# Patient Record
Sex: Female | Born: 1960 | Race: White | Hispanic: No | Marital: Single | Smoking: Never smoker
Health system: Southern US, Community
[De-identification: ages and names within clinical notes are randomized; demographics above are authoritative.]

---

## 1999-05-20 ENCOUNTER — Other Ambulatory Visit: Admission: RE | Admit: 1999-05-20 | Discharge: 1999-05-20 | Payer: Self-pay | Admitting: Obstetrics and Gynecology

## 2007-07-22 ENCOUNTER — Encounter: Admission: RE | Admit: 2007-07-22 | Discharge: 2007-07-22 | Payer: Self-pay | Admitting: Obstetrics and Gynecology

## 2007-08-10 ENCOUNTER — Encounter: Admission: RE | Admit: 2007-08-10 | Discharge: 2007-08-10 | Payer: Self-pay | Admitting: Obstetrics and Gynecology

## 2007-08-10 ENCOUNTER — Encounter (INDEPENDENT_AMBULATORY_CARE_PROVIDER_SITE_OTHER): Payer: Self-pay | Admitting: Diagnostic Radiology

## 2010-02-07 ENCOUNTER — Encounter: Admission: RE | Admit: 2010-02-07 | Discharge: 2010-02-07 | Payer: Self-pay | Admitting: Obstetrics and Gynecology

## 2010-03-30 ENCOUNTER — Encounter: Payer: Self-pay | Admitting: Internal Medicine

## 2013-04-25 ENCOUNTER — Ambulatory Visit: Payer: Self-pay | Admitting: Podiatry

## 2013-05-04 ENCOUNTER — Ambulatory Visit: Payer: Self-pay | Admitting: Podiatry

## 2013-05-23 ENCOUNTER — Ambulatory Visit (INDEPENDENT_AMBULATORY_CARE_PROVIDER_SITE_OTHER): Payer: Managed Care, Other (non HMO) | Admitting: Podiatry

## 2013-05-23 ENCOUNTER — Ambulatory Visit (INDEPENDENT_AMBULATORY_CARE_PROVIDER_SITE_OTHER): Payer: Managed Care, Other (non HMO)

## 2013-05-23 ENCOUNTER — Encounter: Payer: Self-pay | Admitting: Podiatry

## 2013-05-23 DIAGNOSIS — M722 Plantar fascial fibromatosis: Secondary | ICD-10-CM

## 2013-05-23 DIAGNOSIS — G588 Other specified mononeuropathies: Secondary | ICD-10-CM

## 2013-05-23 DIAGNOSIS — G5760 Lesion of plantar nerve, unspecified lower limb: Secondary | ICD-10-CM

## 2013-05-23 DIAGNOSIS — R52 Pain, unspecified: Secondary | ICD-10-CM

## 2013-05-23 MED ORDER — METHYLPREDNISOLONE (PAK) 4 MG PO TABS: ORAL_TABLET | ORAL | Status: DC

## 2013-05-23 MED ORDER — MELOXICAM 15 MG PO TABS: 15.0000 mg | ORAL_TABLET | Freq: Every day | ORAL | Status: DC

## 2013-05-23 NOTE — Progress Notes (Signed)
   Subjective:    Patient ID: Michele MilanPeggy A Lapid, female    DOB: Jul 18, 1960, 53 y.o.   MRN: [id number]  HPI PT STATED RT BALL OF THE FOOT HAVING NERVE PAIN AND LT FOOT HEEL PAIN FOR 2-3 MONTHS. BOTH FEET ARE GETTING WORSE. THE FEET GET AGGRAVATED BY WALKING AND PUTTING PRESSURE. TRIED TO WEAR INSERTS BUT DID NOT HELP.    Review of Systems  Constitutional: Positive for activity change and unexpected weight change.  Musculoskeletal: Positive for gait problem and joint swelling.  Neurological: Positive for headaches.  Hematological: Bruises/bleeds easily.       Objective:   Physical Exam: I have reviewed her past medical history medications allergies surgeries and social history. Vital signs are stable she is alert and oriented x3. Pulses are palpable bilateral. Neurologic sensorium is intact per Semmes-Weinstein monofilament. She has a palpable Mulder's click to the third interdigital space of the right foot. Deep tendon reflexes are intact bilateral muscle strength is 5 over 5 dorsiflexors plantar flexors inverters everters all intrinsic musculature is intact. Orthopedic evaluation demonstrates all joints distal to the ankle a full range of motion without crepitation. She has pain on palpation medial continued tubercle of the left heel. Radiographic evaluation the bilateral foot demonstrates a rectus foot right without complications left foot does demonstrate plantar distally oriented calcaneal heel spur with soft tissue increase in density at the plantar fascial calcaneal insertion site of the left heel.        Assessment & Plan:  Assessment: Plantar fasciitis left. Capsulitis second metatarsophalangeal joint right.  Plan: Discussed the etiology pathology conservative versus surgical therapies. Dispensed a prescription for Medrol Dosepak injected the left heel today as well as the second metatarsophalangeal joint. With Kenalog and local anesthetic. Dispensed a night splint left and a plantar  fascial strapping left I will followup with her in one month.

## 2013-06-06 ENCOUNTER — Ambulatory Visit: Payer: Self-pay | Admitting: Sports Medicine

## 2013-06-20 ENCOUNTER — Ambulatory Visit (INDEPENDENT_AMBULATORY_CARE_PROVIDER_SITE_OTHER): Payer: Managed Care, Other (non HMO) | Admitting: Podiatry

## 2013-06-20 VITALS — BP 138/90 | HR 89 | Resp 16

## 2013-06-20 DIAGNOSIS — M778 Other enthesopathies, not elsewhere classified: Secondary | ICD-10-CM

## 2013-06-20 DIAGNOSIS — M779 Enthesopathy, unspecified: Principal | ICD-10-CM

## 2013-06-20 DIAGNOSIS — M7750 Other enthesopathy of unspecified foot: Secondary | ICD-10-CM

## 2013-06-21 NOTE — Progress Notes (Signed)
She presents today saying her left foot is she refers to the plantar fasciitis. However she still has pain at the second metatarsophalangeal joint of the right foot.  Objective: Vital signs are stable she is alert and oriented x3. Pulses are strongly palpable to the right foot. She has pain on in range of motion of the second metatarsophalangeal joint of the right foot.  Assessment: Pain in limb secondary to capsulitis of the second metatarsophalangeal joint with mild hammertoe deformity.  Plan: Discussed etiology pathology conservative versus surgical therapies. I did suggest another injection today with dexamethasone which was performed after sterile Betadine skin prep. She will continue all other conservative therapies will followup with her in 3 weeks or so.

## 2013-07-11 ENCOUNTER — Ambulatory Visit: Payer: Self-pay | Admitting: Sports Medicine

## 2013-12-07 DIAGNOSIS — M722 Plantar fascial fibromatosis: Secondary | ICD-10-CM

## 2014-01-18 ENCOUNTER — Other Ambulatory Visit: Payer: Self-pay | Admitting: Sports Medicine

## 2014-01-18 ENCOUNTER — Encounter: Payer: Self-pay | Admitting: Sports Medicine

## 2014-01-18 ENCOUNTER — Ambulatory Visit (INDEPENDENT_AMBULATORY_CARE_PROVIDER_SITE_OTHER): Payer: BC Managed Care – PPO | Admitting: Sports Medicine

## 2014-01-18 VITALS — BP 151/83 | HR 93 | Ht 66.0 in | Wt 135.0 lb

## 2014-01-18 DIAGNOSIS — G8929 Other chronic pain: Secondary | ICD-10-CM

## 2014-01-18 DIAGNOSIS — M7741 Metatarsalgia, right foot: Secondary | ICD-10-CM | POA: Diagnosis not present

## 2014-01-18 DIAGNOSIS — M79672 Pain in left foot: Secondary | ICD-10-CM

## 2014-01-18 MED ORDER — AMITRIPTYLINE HCL 25 MG PO TABS: 25.0000 mg | ORAL_TABLET | Freq: Every day | ORAL | Status: DC

## 2014-01-18 NOTE — Progress Notes (Signed)
ESSICA Boone - 53 y.o. female MRN [id number]  Date of birth: February 17, 1961  CC & HPI:  Michele Boone is a new patient who is self-referred for evaluation of: Bilateral foot pain: she reports insidious onset of left forefoot pain that began approximately 6 months ago. Shortly thereafter she began experiencing left posterior heel pain. She was seen by a podiatrist at that time and underwent an injection of her foot for a Morton's neuroma as well as of her right foot for plantar fasciitis. She reported only minimal improvement in the symptoms and is got to the point where she is unable to stand  For more than one to 2 hours or walk for exercise due to the forefoot pain and right foot and heel pain in her left foot. She has tried different shoes and gel heel inserts without any improvement.  She is frustrated and tearful due to the symptoms.   ROS:  Per HPI.   HISTORY: Past Medical, Surgical, Social, and Family History Reviewed & Updated per EMR.  Pertinent Historical Findings include: Otherwise relatively healthy. Current everyday smoker. No prior significant musculoskeletal injuries or surgeries.   Historical Data Reviewed: 3 view x-ray of the bilateral feet from March 2015 - of this year reveal slight calcaneal spurring, bilateral morton's foot, no significant degenerative changes  OBJECTIVE:  VS:   HT:5' 6"  (167.6 cm)   WT:135 lb (61.236 kg)  BMI:21.8          BP:(!) 151/83 mmHg  HR:93bpm  TEMP: ( )  RESP:   PHYSICAL EXAM: GENERAL: Adult caucasian female  female. Slightly emotional due to severity of sx caused by foot pain; no respiratory distress   PSYCH: alert and appropriate, good insight  NEURO: sensation is intact to light touch in bilateral LE  VASCULAR: DP and PT pulses 2+/4.  No significant edema.    Marland KitchenBILATERAL FOOT EXAM: Appearance: Overall normal appearing, no significant deformity Longitudinal Arch: moderate to high, flexible but maintains with weight bearing Transverse Arch: R  flat at rest and worsens with wt bearing, early 4th and 5th curling Calcaneous position with weight bearing: neutral/slight valgus  Skin: No overlying erythema/ecchymosis.  Palpation: TTP over: Right distal met heads of 3&4th MTPs; most focally over over middle plantar aspect of calcaneous, less but still TTP over medial insertion of PF No TTP over: Right PF/calcaneous; bilateral mid arch Metatarsal Squeeze Test: negative bilaterally  Strength & ROM: 5/5 Strength and full active ROM in: plantar flexion, dorsiflexion, inversion and eversion Limited ROM in: bilateral hallux limitus, right worse than left  Special Tests: Repeat Heel Raise: non-fatiguing posterior tibialis      Walking Gait & Functional Exam:  Leg Length: Neutral  General: Antalgic due to left heel pain   Strike/foot: Heel/toe       Limited MSK Ultrasound of bilateral plantar fascia: Findings: Right plantar fascia is normal-appearing and non-thickened. Left is markedly thickened at greater than 0.5 cm with hypoechoic and neovascular changes. There is a area of hypoechoic changes with calcific changes over the mid aspect of the plantar fascia and an apparent split with neovascularity within the plantar fat pad  Impression: The above findings are consistent with left chronic calcific plantar fasciitis and fat pad contusion    ASSESSMENT: 1. Metatarsalgia of right foot   2. Heel pain, chronic, left    Right Foot consistent with metarsalgia due to transverse arch breakdown. Left Foot likely PF from compensation with ?stone bruise.  PLAN: See problem based charting & AVS  for additional documentation. - Sports insoles with left felt heel lift with center cut out; pt reports markedly improved symptoms.  Right with small met pad. - HEP for PF reviewed including PF stretching and eccentric heel raises - Trial Elavil at night for potential neuropathic component given extent of symptoms > consider addition of right 1st ray post  and/or custom cushioned orthotics in future > Return in about 4 weeks (around 02/15/2014).

## 2014-01-18 NOTE — Patient Instructions (Signed)
You have breakdown of the transverse arch on the right foot  Exercises: Plantar fasciitis stretch Heel raises on a step on both feet.  Up fast, down slow over 5 seconds.  Work up to 30, three times per day

## 2014-02-15 ENCOUNTER — Encounter: Payer: Self-pay | Admitting: Sports Medicine

## 2014-02-15 ENCOUNTER — Ambulatory Visit (INDEPENDENT_AMBULATORY_CARE_PROVIDER_SITE_OTHER): Payer: BC Managed Care – PPO | Admitting: Sports Medicine

## 2014-02-15 VITALS — BP 133/70 | Ht 66.0 in | Wt 135.0 lb

## 2014-02-15 DIAGNOSIS — M722 Plantar fascial fibromatosis: Secondary | ICD-10-CM

## 2014-02-15 DIAGNOSIS — G5761 Lesion of plantar nerve, right lower limb: Secondary | ICD-10-CM

## 2014-02-15 NOTE — Progress Notes (Signed)
   Subjective:    Patient ID: Michele Boone, female    DOB: 1960-05-25, 53 y.o.   MRN: [id number]  HPI  RIGHT FOOT/TOE PAIN: - Last seen at [facility] 01/18/14 for same complaint x 6 months, thought to be R-metatarsalgia pain due to transverse arch breakdown, given sports insoles with metatarsal pad, trial on Elavil for neuropathic pain - Patient reports that she has worn insoles regularly with athletic shoes on most days, without significant relief. Complains of similar vs worsening pain, described as "sharp, nerve pain" with associated localized numbness at base of 3rd-4th toes. Worse with weightbearing and prolonged standing / activity. - Tried Elavil 47m nightly as prescribed without relief. No other medications for this problem.  LEFT HEEL PAIN: [facility] x 6 months, thought to be Left Plantar Fasciitis due to compensation from R foot pain, and questionable bone bruise, given sports insole with L heel lift center cut out - Patient reports regularly wearing insoles without significant relief. Complains of worsening L heel pain, worse with weightbearing and ambulation. Improved with heel wedge/lift shoes.  I have reviewed and updated the following as appropriate: allergies and current medications  Social Hx: - Never smoker  Review of Systems  - Denies any foot swelling, erythema, weakness, or radiating pain or numbness     Objective:   Physical Exam  BP 133/70 mmHg  Ht _0  (1.676 m)  Wt 135 lb (61.236 kg)  BMI 21.80 kg/m2  Gen - well-appearing, NAD MSK: - Right Foot: normal appearance without significant deformity, effusion or erythema, flat transverse metatarsal arch but flexible on palpation, localized tenderness directly over 3rd/4th metatarsal heads, compression test without significant pain, isolated pressure test over digital nerves with some numbness but without pain. Full active ROM - Left Foot: normal appearance w/o deformity. Localized  tenderness over palpation of L posterior heel at location of calcaneus without erythema or effusion. No significant tenderness medially or further down plantar fascia Ext - no edema, peripheral pulses intact +2 b/l Skin - warm, dry, no rashes Neuro - b/l ankle str intact 5/5, intact distal sensation to light touch, antalgic gait due to L heel pain  Bedside [place] - Right Foot / Metatarsals 3-5: Identified moderate sized neuroma between R 3/4 MT space and small neuroma R 4/5 MT, without any bony deformities - Left Heel / Plantar Fascia: Suggestive of chronic plantar fasciitis changes with presumed calcaneal cortical irregularity     Assessment & Plan:   89yr F presents for f/u with worsening Left heel pain and Right foot/toe pain x 1 month, no relief with insoles (R-met pad and L-heel lift cut out), Elavil 230mqhs not improving neuropathic pain. 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 Discontinue Elavil 2. Add heel wedge left and heel lift pad to RT insoles. Right insole remove met pad and add smaller neuroma pad and forefoot cushion pad. 3. Given Left body helix ankle/heel compression sleeve, reduce pressure on calcaneus 4. Continue previous stretching / exercises for bilateral feet, avoid overuse activity 5. RTC 1-2 months if no improvement, consider trial of Gabapentin for neuropathic pain  AlNobie PutnamDORussell GardensPGY-2  Agree/ edited and reviewed  KaStefanie LibelMD

## 2014-03-21 ENCOUNTER — Ambulatory Visit (INDEPENDENT_AMBULATORY_CARE_PROVIDER_SITE_OTHER): Payer: BLUE CROSS/BLUE SHIELD | Admitting: Sports Medicine

## 2014-03-21 ENCOUNTER — Encounter: Payer: Self-pay | Admitting: Sports Medicine

## 2014-03-21 VITALS — BP 137/81 | HR 88 | Ht 66.0 in | Wt 145.0 lb

## 2014-03-21 DIAGNOSIS — G5761 Lesion of plantar nerve, right lower limb: Secondary | ICD-10-CM

## 2014-03-21 DIAGNOSIS — M722 Plantar fascial fibromatosis: Secondary | ICD-10-CM | POA: Diagnosis not present

## 2014-03-21 MED ORDER — TRIAMCINOLONE ACETONIDE 10 MG/ML IJ SUSP
10.0000 mg | Freq: Once | INTRAMUSCULAR | Status: AC
  Administered 2014-03-21: 10 mg via INTRA_ARTICULAR

## 2014-03-21 NOTE — Progress Notes (Signed)
   Subjective:    Patient ID: Michele Boone, female    DOB: [birth year], 54 y.o.   MRN: [id number]  HPI Ms. Michele Boone is a 54 year old female who presents for follow-up of bilateral foot pain. She has had left foot pain for the past 6 months to 1 year. She denies any acute injury. She has been treated for plantar fasciitis with orthotic modifications, heel drop exercises, ice, and stretching. Despite this, she says that her left foot pain persists. Location of pain is over the calcaneal region of the plantar fascia. She denies any bruising or swelling. Pain is aggravated with activity with the first few steps in the morning. Her right foot also continues to bother her and at her last visit she was found to have small Morton's neuroma at the 3-4 and 4-5 interweb spaces. Symptoms are aggravated with walking barefoot. She notes a burning and tingling sensation distally in the fourth and fifth toes. Relieved with elevation. She is concerned because she is significantly limited in her ability to exercise due to pain.  Past medical history, social history, medications, and allergies were reviewed and are up to date in the chart. Review of Systems 7 point review of systems was performed and was otherwise negative unless noted in the history of present illness.     Objective:   Physical Exam BP 137/81 mmHg  Pulse 88  Ht 5\' 6"  (1.676 m)  Wt 145 lb (65.772 kg)  BMI 23.41 kg/m2 GEN: The patient is well-developed well-nourished female and in no acute distress.  She is awake alert and oriented x3. SKIN: warm and well-perfused, no rash  EXTR: No lower extremity edema or calf tenderness Neuro: Strength 5/5 globally. Sensation intact throughout. No focal deficits. Vasc: +2 bilateral distal pulses. No edema.  MSK: Examination of the right foot reveals no swelling or palpable defect over the fourth and fifth web spaces. She does have pain with palpation however. She has slight dropping of the transverse arch. She  also has some pain with motion at the first MTP on the right, without swelling, induration, or warmth. Examination of the left foot reveals tenderness to palpation over the proximal plantar fascia insertion at the calcaneus. No swelling or erythema. She has slight dropping of the medial longitudinal arch on the left.  Limited musculoskeletal ultrasound: Long and short axis views were obtained. The left plantar fascia measures 0.66 cm at the proximal insertion. At the insertion site on the calcaneus, a small calcific spur is seen with surrounding fluid edema. There is slightly increased Doppler flow around this area. Ultrasound of the right first MTP reveals calcific degenerative changes in the joint with mild increase in surrounding joint fluid.  Procedure: After obtaining verbal consent patient's skin was cleansed with alcohol and Betadine.  Subsequently the left proximal plantar fascia was injected just deep to the fascia with a 1-1 mixture of Kenalog 10 mg per 1 mL 1% lidocaine plain under ultrasound guidance.  Prior to procedure risks, benefits and treatment alternatives were discussed.  The patient tolerated the procedure and no complications were noted.     Assessment & Plan:  Please see problem based assessment and plan in the problem list.

## 2014-03-21 NOTE — Assessment & Plan Note (Signed)
-  Orthotic modifications were made to the right sport insole:  -Neuroma pad  -Removal of heel lift  -Replacement of thin blue foam at the forefoot and metatarsal heads -We will see she does with this modification, and she'll follow-up in one to 2 months.

## 2014-03-21 NOTE — Assessment & Plan Note (Signed)
-  Patient tolerated the plantar fascia injection today and ultrasound confirmed proper placement. -The following modifications were made with a new left support insole  -The left long heel lift was removed  -A small scaphoid pad was placed  -In addition, she was given an arch strap to trial -She will continue to ice if feeling inflamed, eccentric heel exercises -Plan follow-up in one to 2 months or sooner if needed

## 2014-03-23 ENCOUNTER — Telehealth: Payer: Self-pay | Admitting: *Deleted

## 2014-03-23 MED ORDER — TRAMADOL HCL 50 MG PO TABS
50.0000 mg | ORAL_TABLET | Freq: Four times a day (QID) | ORAL | Status: DC | PRN
Start: 2014-03-23 — End: 2019-09-19

## 2014-03-23 NOTE — Telephone Encounter (Signed)
-----   Message from Enid BaasKarl Fields, MD sent at 03/22/2014 11:21 PM EST ----- Regarding: RE: phone message Contact: [phone] We mentioned tramadol 50 mgm Take up to q 6 h prn 90 with 1 RF ----- Message -----    From: Linward Headlandhea N Baily Hovanec, RN    Sent: 03/22/2014   1:49 PM      To: Enid BaasKarl Fields, MD, Danelle BerryJohn C Pick-Jacobs, DO Subject: FW: phone message                              Do you know what pain med you mentioned to this pt? There isn't anything in her notes about it. She said the meloxicam wasn't working and you said that she could try something different    ----- Message -----    From: Lizbeth BarkMelanie L Ceresi    Sent: 03/22/2014   1:17 PM      To: Linward Headlandhea N Granvil Djordjevic, RN Subject: phone message                                  Pt states pain med's weren't sent to the pharmacy yesterday. Pharmacy is in chart.

## 2014-03-23 NOTE — Telephone Encounter (Signed)
Placed order for Tramadol, per Dr. Darrick PennaFields

## 2014-04-12 ENCOUNTER — Ambulatory Visit: Payer: BC Managed Care – PPO | Admitting: Sports Medicine

## 2014-05-23 ENCOUNTER — Other Ambulatory Visit: Payer: Self-pay

## 2014-05-23 DIAGNOSIS — Z1231 Encounter for screening mammogram for malignant neoplasm of breast: Secondary | ICD-10-CM

## 2014-05-24 ENCOUNTER — Ambulatory Visit
Admission: RE | Admit: 2014-05-24 | Discharge: 2014-05-24 | Disposition: A | Discharge status: Home / Self Care | Payer: BLUE CROSS/BLUE SHIELD | Source: Ambulatory Visit

## 2014-05-24 DIAGNOSIS — Z1231 Encounter for screening mammogram for malignant neoplasm of breast: Secondary | ICD-10-CM

## 2014-05-25 ENCOUNTER — Other Ambulatory Visit: Payer: Self-pay | Admitting: Internal Medicine

## 2014-05-25 ENCOUNTER — Other Ambulatory Visit (HOSPITAL_COMMUNITY)
Admission: RE | Admit: 2014-05-25 | Discharge: 2014-05-25 | Disposition: A | Discharge status: Home / Self Care | Payer: BLUE CROSS/BLUE SHIELD | Source: Ambulatory Visit | Attending: Internal Medicine | Admitting: Internal Medicine

## 2014-05-25 DIAGNOSIS — Z1151 Encounter for screening for human papillomavirus (HPV): Secondary | ICD-10-CM | POA: Insufficient documentation

## 2014-05-25 DIAGNOSIS — Z01411 Encounter for gynecological examination (general) (routine) with abnormal findings: Secondary | ICD-10-CM | POA: Diagnosis present

## 2014-05-29 LAB — CYTOLOGY - PAP

## 2014-08-14 ENCOUNTER — Encounter: Payer: Self-pay | Admitting: Sports Medicine

## 2014-08-14 ENCOUNTER — Ambulatory Visit (INDEPENDENT_AMBULATORY_CARE_PROVIDER_SITE_OTHER): Payer: BLUE CROSS/BLUE SHIELD | Admitting: Sports Medicine

## 2014-08-14 VITALS — BP 140/65 | HR 87 | Ht 66.0 in | Wt 145.0 lb

## 2014-08-14 DIAGNOSIS — M722 Plantar fascial fibromatosis: Secondary | ICD-10-CM

## 2014-08-14 DIAGNOSIS — G5761 Lesion of plantar nerve, right lower limb: Secondary | ICD-10-CM

## 2014-08-14 MED ORDER — AMITRIPTYLINE HCL 25 MG PO TABS: 25.0000 mg | ORAL_TABLET | Freq: Every day | ORAL | Status: DC

## 2014-08-14 NOTE — Assessment & Plan Note (Signed)
Neuromas not really noted on today's US  Suspect this was Intra MT swelling before  Try larger MT pad and more cushion at base of MTP  May need custom orthotics

## 2014-08-14 NOTE — Progress Notes (Signed)
S: Michele Boone states that she has had some improvement in her foot pain since her last visit, but that she does continue to have pain that interferes with her daily life. She states that walking or standing for 30 minutes or more causes her unbearable pain, and that she has missed several activities with friends and coworkers as a result. She states that her predominant symptom is pain, worst between her 3rd and 4th toes on her right foot. She states that this pain is present intermittently; not present at rest, and not present with light activity, but increasingly painful with prolonged walking or other weight-bearing exercise.  Note her left PF is now not painful with sports insole, scaphoid pad and S/P CSI  O: Benign physical exam. Normal range of motion and strength in toes bilaterally. Right foot is only TTP at base of 3rd /4th MTP plantar/, nonedematous, and nonerythematous throughout. Exam remarkable only for diminished transverse arch in her right foot. Now no TTP over left PF insertion   Ultrasound showed some effusion around the 4th MTP plantar surface, no neuroma or ganglion was evident/ there is some hypoechoic change around flexor tendon/ ID nerve is visualized but no neuroma and amount of hypoechoic change is less  A/P:MTP joint pain Prepared new insoles with a transverse arch pad to restore her arch and reduce the load on the symptomatic toes. Tried a hammar-toe pad with no relief. Follow-up in 6 weeks with plans to prepare custom orthotics if her new insoles do not relieve her pain. Also renewed her prescription for Amitriptyline.   Note dictated by Avie ArenasWilliam Runge, MS-4  Agree and edited /  Michele BigKB Yarima Penman, MD

## 2014-08-14 NOTE — Assessment & Plan Note (Signed)
This is much improved with no pain on exam today  No real limp today

## 2014-09-25 ENCOUNTER — Encounter: Payer: Self-pay | Admitting: Sports Medicine

## 2014-09-25 ENCOUNTER — Ambulatory Visit (INDEPENDENT_AMBULATORY_CARE_PROVIDER_SITE_OTHER): Payer: BLUE CROSS/BLUE SHIELD | Admitting: Sports Medicine

## 2014-09-25 VITALS — BP 130/82 | Ht 66.0 in | Wt 145.0 lb

## 2014-09-25 DIAGNOSIS — G5761 Lesion of plantar nerve, right lower limb: Secondary | ICD-10-CM

## 2014-09-25 DIAGNOSIS — M722 Plantar fascial fibromatosis: Secondary | ICD-10-CM

## 2014-09-25 NOTE — Progress Notes (Signed)
Patient ID: Reubin MilanPeggy A Hust, female   DOB: [birth year], 54 y.o.   MRN: [id number]  Patient was significant pain in her right forefoot and in her left plantar fasciitis We have followed her since November Prior to that she's been treated by podiatry but had not had significant pain relief  Left plantar fascial type pain has lessened considerably by using arch support I tried different strategies but on her last visit used a metatarsal cookie This lessened the metatarsalgia and the neuroma pain on the right forefoot  Plan was to make her custom orthotics and build in these changes today  Exam No acute distress She walks with much less antalgia than before BP 130/82 mmHg  Ht 5\' 6"  (1.676 m)  Wt 145 lb (65.772 kg)  BMI 23.41 kg/m2  Loss of longitudinal arch with some pronation left greater than right This is unchanged Loss of transverse arch somewhat bilaterally but more on the right  Today not much tenderness to palpation

## 2014-09-25 NOTE — Assessment & Plan Note (Signed)
Pain in both feet-- we are starting to get improvements with different type of customized padding  Trial of a custom orthotic  Patient was fitted for a : standard, cushioned, semi-rigid orthotic. The orthotic was heated and afterward the patient stood on the orthotic blank positioned on the orthotic stand. The patient was positioned in subtalar neutral position and 10 degrees of ankle dorsiflexion in a weight bearing stance. After completion of molding, a stable base was applied to the orthotic blank. The blank was ground to a stable position for weight bearing. Size: 7 red Soft-Cell EVA Base: Soft-Cell padding heel and forefoot Posting: none Additional orthotic padding: metatarsal cookie/ men's on right  Evaluation and preparation time 40 minutes  After completion she was able to walk with no antalgia in a much more normal gait  We will recheck her in 2 months

## 2014-09-25 NOTE — Assessment & Plan Note (Signed)
Small neuroma pads were not helpful  Using a man's size metatarsal cookie

## 2014-11-23 ENCOUNTER — Other Ambulatory Visit: Payer: Self-pay | Admitting: Internal Medicine

## 2014-11-23 ENCOUNTER — Ambulatory Visit
Admission: RE | Admit: 2014-11-23 | Discharge: 2014-11-23 | Disposition: A | Discharge status: Home / Self Care | Payer: BLUE CROSS/BLUE SHIELD | Source: Ambulatory Visit | Attending: Internal Medicine | Admitting: Internal Medicine

## 2014-11-23 DIAGNOSIS — M5489 Other dorsalgia: Secondary | ICD-10-CM

## 2015-06-17 DIAGNOSIS — Z23 Encounter for immunization: Secondary | ICD-10-CM | POA: Diagnosis not present

## 2015-06-17 DIAGNOSIS — Z0000 Encounter for general adult medical examination without abnormal findings: Secondary | ICD-10-CM | POA: Diagnosis not present

## 2015-06-17 DIAGNOSIS — E559 Vitamin D deficiency, unspecified: Secondary | ICD-10-CM | POA: Diagnosis not present

## 2015-07-13 DIAGNOSIS — R200 Anesthesia of skin: Secondary | ICD-10-CM | POA: Diagnosis not present

## 2015-07-15 DIAGNOSIS — G5602 Carpal tunnel syndrome, left upper limb: Secondary | ICD-10-CM | POA: Diagnosis not present

## 2015-07-15 DIAGNOSIS — F419 Anxiety disorder, unspecified: Secondary | ICD-10-CM | POA: Diagnosis not present

## 2015-09-02 DIAGNOSIS — M180 Bilateral primary osteoarthritis of first carpometacarpal joints: Secondary | ICD-10-CM | POA: Diagnosis not present

## 2015-09-02 DIAGNOSIS — G5602 Carpal tunnel syndrome, left upper limb: Secondary | ICD-10-CM | POA: Diagnosis not present

## 2015-09-02 DIAGNOSIS — M1812 Unilateral primary osteoarthritis of first carpometacarpal joint, left hand: Secondary | ICD-10-CM | POA: Diagnosis not present

## 2015-09-02 DIAGNOSIS — M1811 Unilateral primary osteoarthritis of first carpometacarpal joint, right hand: Secondary | ICD-10-CM | POA: Diagnosis not present

## 2015-09-09 DIAGNOSIS — M180 Bilateral primary osteoarthritis of first carpometacarpal joints: Secondary | ICD-10-CM | POA: Diagnosis not present

## 2015-11-04 DIAGNOSIS — H04123 Dry eye syndrome of bilateral lacrimal glands: Secondary | ICD-10-CM | POA: Diagnosis not present

## 2015-11-04 DIAGNOSIS — H52203 Unspecified astigmatism, bilateral: Secondary | ICD-10-CM | POA: Diagnosis not present

## 2015-11-13 DIAGNOSIS — J22 Unspecified acute lower respiratory infection: Secondary | ICD-10-CM | POA: Diagnosis not present

## 2015-11-13 DIAGNOSIS — R0602 Shortness of breath: Secondary | ICD-10-CM | POA: Diagnosis not present

## 2016-07-19 IMAGING — CR DG LUMBAR SPINE COMPLETE 4+V
5 series · 5 of 5 positions shown · non-contrast
Comparison: None.

CLINICAL DATA: One week history of low back pain. No known
injuries.

EXAM:
LUMBAR SPINE - COMPLETE 4+ VIEW

[t l-spine a.p.]
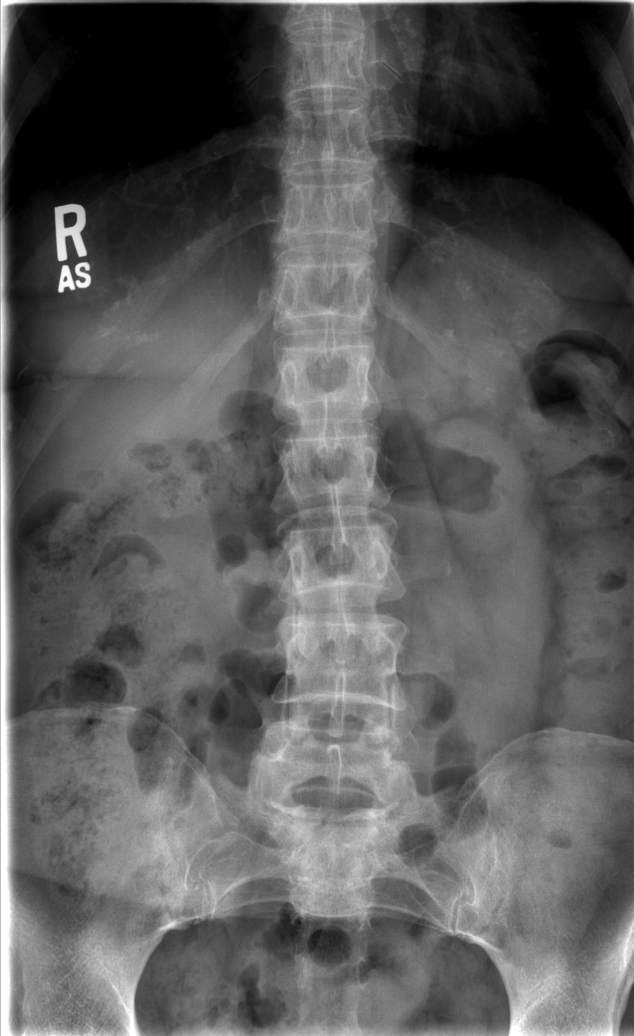

[t l-spine oblique exposure (1 of 2)]
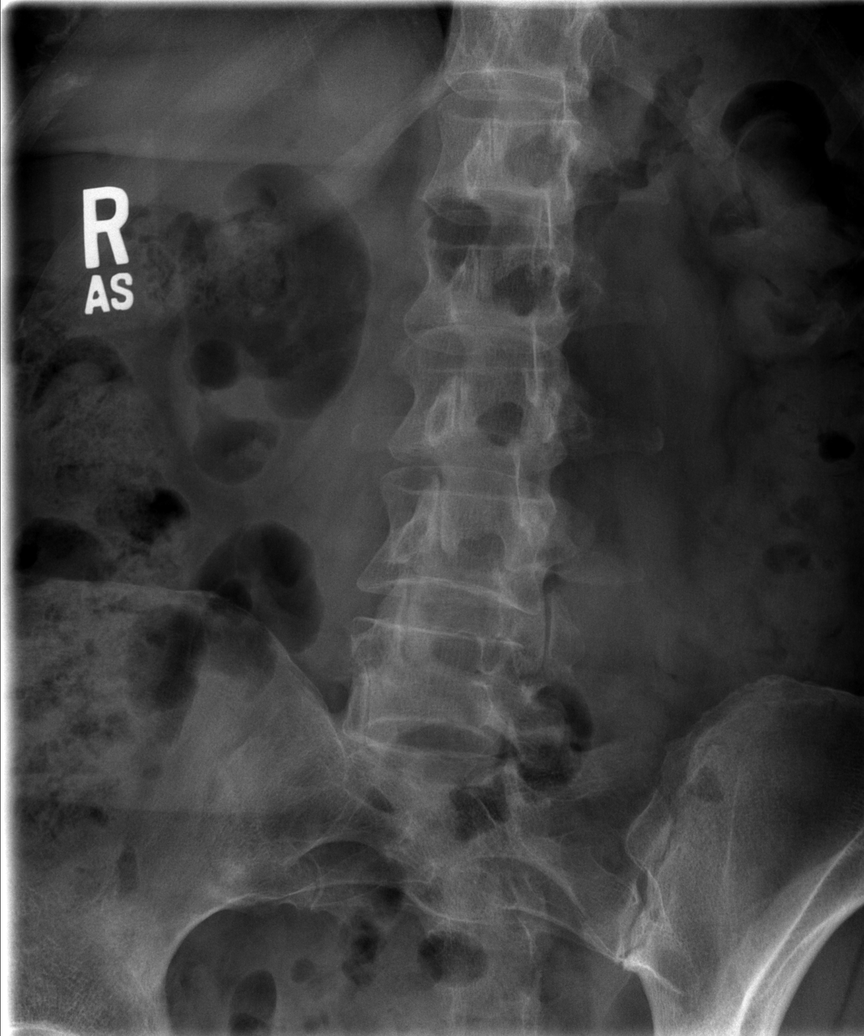

[t l-spine oblique exposure (2 of 2)]
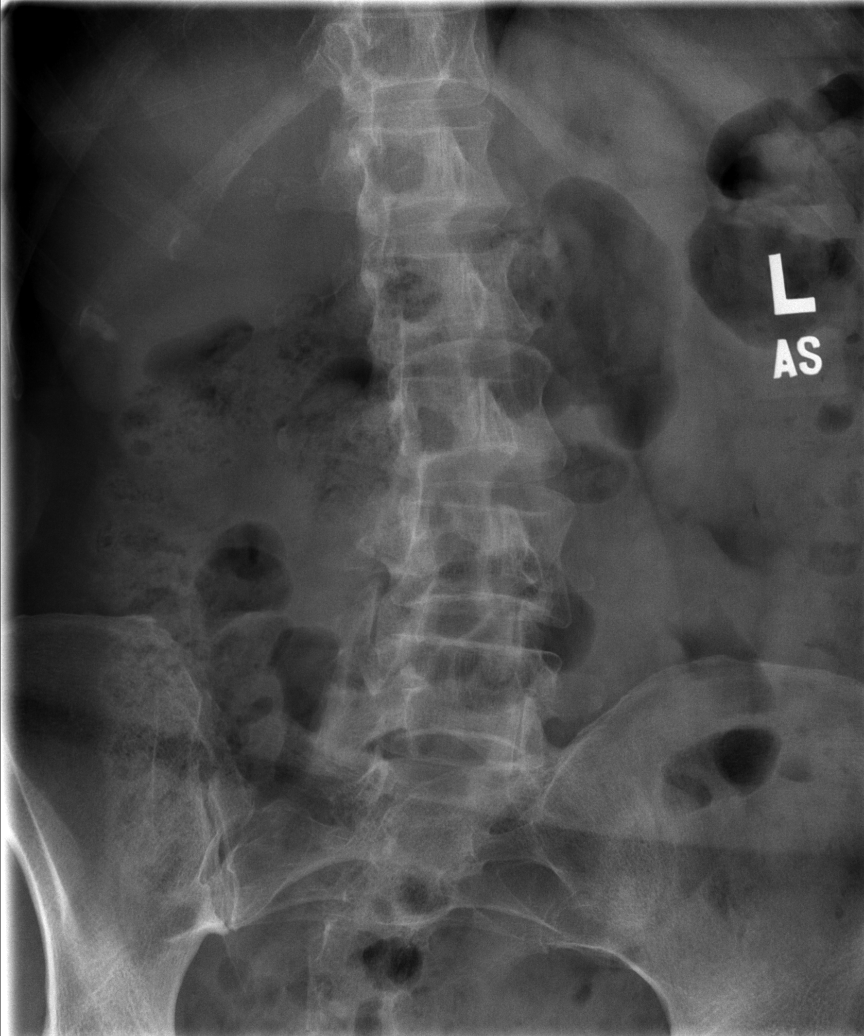

[t l-spine lat]
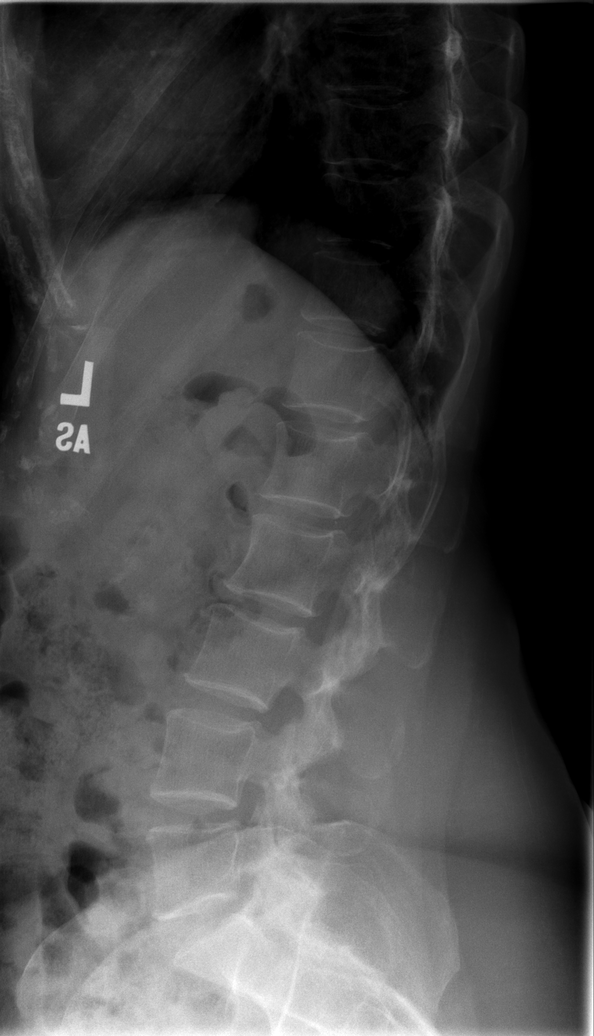

[t l-spine l5-s1 spot]
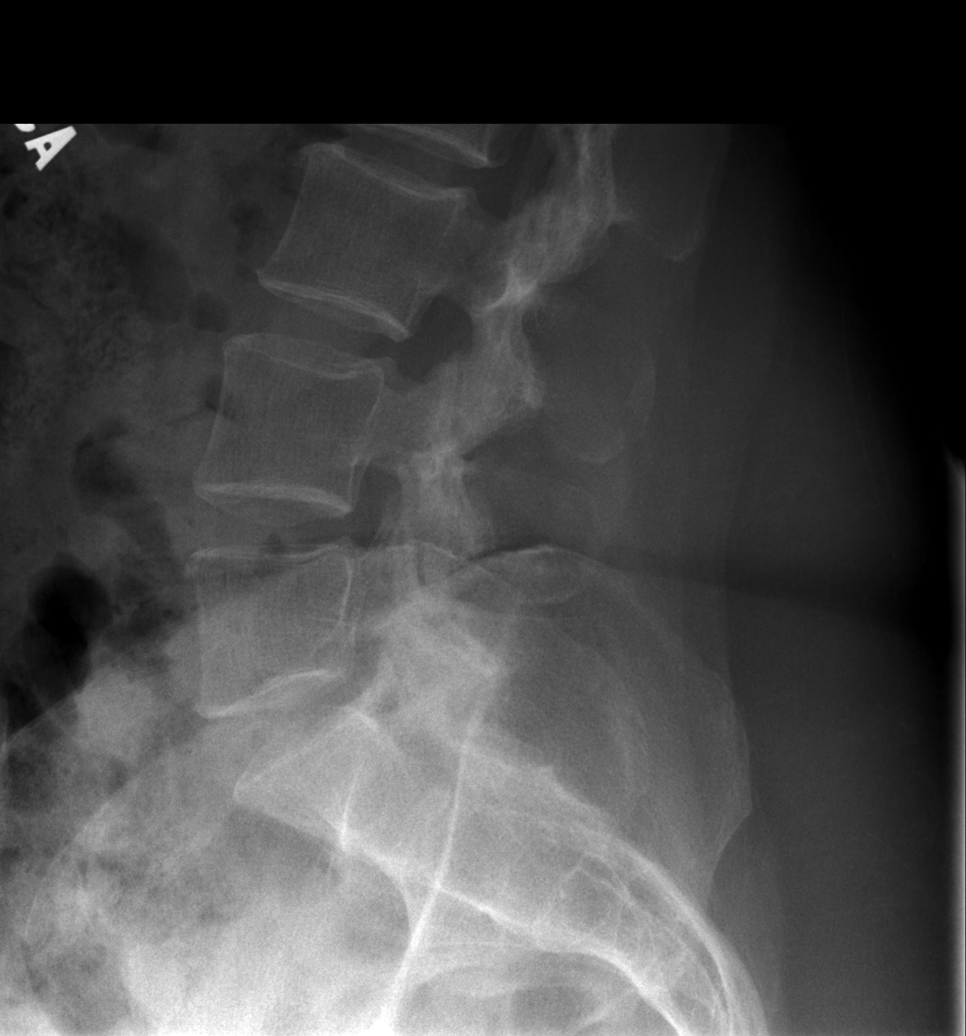

[5 of 5 positions shown; findings below may reference images not displayed]

FINDINGS: Five non rib-bearing lumbar vertebrae with anatomic alignment. No
fractures. Mild disc space narrowing and associated endplate
hypertrophic changes at L2-3. Remaining disc spaces well preserved.
Very slight thoracolumbar scoliosis convex right. No pars defects.
Bilateral facet degenerative changes at L5-S1. Visualized sacroiliac
joints intact.
IMPRESSION: 1. Mild degenerative disc disease and spondylosis at L2-3.
2. Bilateral facet degenerative changes at L5-S1.

## 2016-11-04 DIAGNOSIS — R300 Dysuria: Secondary | ICD-10-CM | POA: Diagnosis not present

## 2016-11-04 DIAGNOSIS — F17210 Nicotine dependence, cigarettes, uncomplicated: Secondary | ICD-10-CM | POA: Diagnosis not present

## 2016-11-16 DIAGNOSIS — E785 Hyperlipidemia, unspecified: Secondary | ICD-10-CM | POA: Diagnosis not present

## 2016-11-16 DIAGNOSIS — F17210 Nicotine dependence, cigarettes, uncomplicated: Secondary | ICD-10-CM | POA: Diagnosis not present

## 2016-11-16 DIAGNOSIS — R300 Dysuria: Secondary | ICD-10-CM | POA: Diagnosis not present

## 2017-01-01 DIAGNOSIS — R3121 Asymptomatic microscopic hematuria: Secondary | ICD-10-CM | POA: Diagnosis not present

## 2017-01-01 DIAGNOSIS — R3912 Poor urinary stream: Secondary | ICD-10-CM | POA: Diagnosis not present

## 2017-01-07 DIAGNOSIS — E559 Vitamin D deficiency, unspecified: Secondary | ICD-10-CM | POA: Diagnosis not present

## 2017-01-07 DIAGNOSIS — F17210 Nicotine dependence, cigarettes, uncomplicated: Secondary | ICD-10-CM | POA: Diagnosis not present

## 2017-01-07 DIAGNOSIS — E785 Hyperlipidemia, unspecified: Secondary | ICD-10-CM | POA: Diagnosis not present

## 2017-01-07 DIAGNOSIS — Z716 Tobacco abuse counseling: Secondary | ICD-10-CM | POA: Diagnosis not present

## 2017-01-07 DIAGNOSIS — Z0000 Encounter for general adult medical examination without abnormal findings: Secondary | ICD-10-CM | POA: Diagnosis not present

## 2017-04-29 DIAGNOSIS — H04123 Dry eye syndrome of bilateral lacrimal glands: Secondary | ICD-10-CM | POA: Diagnosis not present

## 2017-04-29 DIAGNOSIS — H01001 Unspecified blepharitis right upper eyelid: Secondary | ICD-10-CM | POA: Diagnosis not present

## 2017-04-29 DIAGNOSIS — H524 Presbyopia: Secondary | ICD-10-CM | POA: Diagnosis not present

## 2017-04-29 DIAGNOSIS — H01004 Unspecified blepharitis left upper eyelid: Secondary | ICD-10-CM | POA: Diagnosis not present

## 2017-06-09 DIAGNOSIS — H6123 Impacted cerumen, bilateral: Secondary | ICD-10-CM | POA: Diagnosis not present

## 2017-06-09 DIAGNOSIS — R05 Cough: Secondary | ICD-10-CM | POA: Diagnosis not present

## 2017-06-09 DIAGNOSIS — J018 Other acute sinusitis: Secondary | ICD-10-CM | POA: Diagnosis not present

## 2017-06-17 DIAGNOSIS — R05 Cough: Secondary | ICD-10-CM | POA: Diagnosis not present

## 2019-09-19 ENCOUNTER — Ambulatory Visit: Payer: Self-pay | Admitting: Podiatrist

## 2019-09-19 ENCOUNTER — Encounter: Payer: Self-pay | Admitting: Podiatrist

## 2019-09-19 ENCOUNTER — Other Ambulatory Visit: Payer: Self-pay

## 2019-09-19 VITALS — BP 162/88 | HR 83 | Temp 98.8°F | Resp 16

## 2019-09-19 DIAGNOSIS — B351 Tinea unguium: Secondary | ICD-10-CM

## 2019-09-19 NOTE — Progress Notes (Signed)
  Chief Complaint  Patient presents with  . Nail Problem    Bilateral; all nails; thickened nails; some discoloration; pt stated, "Need nails checked for nail fungus; tried vapor rub and soaked in cider vinegar-did not help"; x1+ yrs     HPI: Patient is 59 y.o. female who presents today for the concerns as listed above.    Review of Systems No fevers, chills, nausea, muscle aches, no difficulty breathing, no calf pain, no chest pain or shortness of breath.   Physical Exam  GENERAL APPEARANCE: Alert, conversant. Appropriately groomed. No acute distress.   VASCULAR: Pedal pulses palpable DP and PT bilateral.  Capillary refill time is immediate to all digits,  Proximal to distal cooling it warm to warm.  Digital perfusion adequate.   NEUROLOGIC: sensation is intact epicritically and protectively to 5.07 monofilament at 5/5 sites bilateral.  Light touch is intact bilateral, vibratory sensation intact bilateral, achilles tendon reflex is intact bilateral.   MUSCULOSKELETAL: acceptable muscle strength, tone and stability bilateral.  No gross boney pedal deformities noted.  No pain, crepitus or limitation noted with foot and ankle range of motion bilateral.   DERMATOLOGIC: skin is warm, supple, and dry.  No open lesions noted.  No rash, no pre ulcerative lesions. Digital nails are thick, yellow and appear mycotic      Assessment     ICD-10-CM   1. Onychomycosis  B35.1      Plan  Took a sample of the right second toenail to send for pathology.  We will notify her of the result and treatment options at that time.

## 2019-09-19 NOTE — Addendum Note (Signed)
Addended by: Fritz Pickerel A on: 09/19/2019 05:09 PM   Modules accepted: Orders

## 2019-09-20 ENCOUNTER — Telehealth: Payer: Self-pay | Admitting: Podiatrist

## 2019-09-20 NOTE — Telephone Encounter (Signed)
I was just seen yesterday and I wanted to confirm or verify that visit was filed with my [employer]. Thursday is my last day with my job so I need to make sure my visit yesterday was submitted and then I need to know if there is a balance I owe you guys. If you could please call me back at [phone]. Thank you.

## 2019-09-23 ENCOUNTER — Ambulatory Visit: Payer: Managed Care, Other (non HMO) | Admitting: Podiatry

## 2019-10-20 ENCOUNTER — Telehealth: Payer: Self-pay | Admitting: *Deleted

## 2019-10-20 NOTE — Telephone Encounter (Signed)
Message left for pt to call for results.

## 2019-10-20 NOTE — Telephone Encounter (Signed)
Delories Heinz, DPM  Marissa Nestle, RN Starting to second guess myself in if I handled these! Peggys fungal culture is negative for fungus. Micro trauma is the reason for her nail discoloration and thickening- nuvail topical is available if she would like to treat. ( sometimes pricy if not covered by insurance) or no treatment necessary   Thanks so much! Dr E!

## 2021-08-12 DIAGNOSIS — H353112 Nonexudative age-related macular degeneration, right eye, intermediate dry stage: Secondary | ICD-10-CM | POA: Diagnosis not present

## 2021-08-12 DIAGNOSIS — H353221 Exudative age-related macular degeneration, left eye, with active choroidal neovascularization: Secondary | ICD-10-CM | POA: Diagnosis not present

## 2021-09-28 DIAGNOSIS — S92354A Nondisplaced fracture of fifth metatarsal bone, right foot, initial encounter for closed fracture: Secondary | ICD-10-CM | POA: Diagnosis not present

## 2021-09-29 DIAGNOSIS — M79671 Pain in right foot: Secondary | ICD-10-CM | POA: Diagnosis not present

## 2021-10-29 DIAGNOSIS — M79671 Pain in right foot: Secondary | ICD-10-CM | POA: Diagnosis not present

## 2021-10-31 DIAGNOSIS — H43811 Vitreous degeneration, right eye: Secondary | ICD-10-CM | POA: Diagnosis not present

## 2021-10-31 DIAGNOSIS — H353112 Nonexudative age-related macular degeneration, right eye, intermediate dry stage: Secondary | ICD-10-CM | POA: Diagnosis not present

## 2021-10-31 DIAGNOSIS — H353221 Exudative age-related macular degeneration, left eye, with active choroidal neovascularization: Secondary | ICD-10-CM | POA: Diagnosis not present

## 2021-12-08 DIAGNOSIS — M79671 Pain in right foot: Secondary | ICD-10-CM | POA: Diagnosis not present

## 2022-01-23 DIAGNOSIS — H43811 Vitreous degeneration, right eye: Secondary | ICD-10-CM | POA: Diagnosis not present

## 2022-01-23 DIAGNOSIS — H353112 Nonexudative age-related macular degeneration, right eye, intermediate dry stage: Secondary | ICD-10-CM | POA: Diagnosis not present

## 2022-01-23 DIAGNOSIS — H35371 Puckering of macula, right eye: Secondary | ICD-10-CM | POA: Diagnosis not present

## 2022-01-23 DIAGNOSIS — H353221 Exudative age-related macular degeneration, left eye, with active choroidal neovascularization: Secondary | ICD-10-CM | POA: Diagnosis not present

## 2022-04-17 DIAGNOSIS — H353221 Exudative age-related macular degeneration, left eye, with active choroidal neovascularization: Secondary | ICD-10-CM | POA: Diagnosis not present

## 2022-05-10 DIAGNOSIS — M1712 Unilateral primary osteoarthritis, left knee: Secondary | ICD-10-CM | POA: Diagnosis not present

## 2022-07-10 DIAGNOSIS — H43822 Vitreomacular adhesion, left eye: Secondary | ICD-10-CM | POA: Diagnosis not present

## 2022-07-10 DIAGNOSIS — H353112 Nonexudative age-related macular degeneration, right eye, intermediate dry stage: Secondary | ICD-10-CM | POA: Diagnosis not present

## 2022-07-10 DIAGNOSIS — H43811 Vitreous degeneration, right eye: Secondary | ICD-10-CM | POA: Diagnosis not present

## 2022-07-10 DIAGNOSIS — H35371 Puckering of macula, right eye: Secondary | ICD-10-CM | POA: Diagnosis not present

## 2022-07-10 DIAGNOSIS — H353221 Exudative age-related macular degeneration, left eye, with active choroidal neovascularization: Secondary | ICD-10-CM | POA: Diagnosis not present

## 2022-10-23 DIAGNOSIS — H353221 Exudative age-related macular degeneration, left eye, with active choroidal neovascularization: Secondary | ICD-10-CM | POA: Diagnosis not present

## 2022-12-09 ENCOUNTER — Ambulatory Visit: Payer: BC Managed Care – PPO | Admitting: Orthopaedic Surgery

## 2022-12-09 ENCOUNTER — Other Ambulatory Visit (INDEPENDENT_AMBULATORY_CARE_PROVIDER_SITE_OTHER): Payer: BC Managed Care – PPO

## 2022-12-09 DIAGNOSIS — G8929 Other chronic pain: Secondary | ICD-10-CM | POA: Diagnosis not present

## 2022-12-09 DIAGNOSIS — M25562 Pain in left knee: Secondary | ICD-10-CM | POA: Diagnosis not present

## 2022-12-09 MED ORDER — LIDOCAINE HCL 1 % IJ SOLN
3.0000 mL | INTRAMUSCULAR | Status: AC | PRN
  Administered 2022-12-09: 3 mL

## 2022-12-09 MED ORDER — METHYLPREDNISOLONE ACETATE 40 MG/ML IJ SUSP
40.0000 mg | INTRAMUSCULAR | Status: AC | PRN
  Administered 2022-12-09: 40 mg via INTRA_ARTICULAR

## 2022-12-09 NOTE — Progress Notes (Signed)
The patient is a very pleasant and active 62 year old female who comes in with left knee pain and swelling is been going on for some time now.  She denies any injury but it does hurt with weightbearing and it is starting to detrimentally affect her mobility, her quality of life and her actives daily living.  She does have some pain but the knee swells quite a bit on occasion.  She says she has no issues with her right knee at all.  She is not a diabetic.  She is not obese.  She has worked on activity modification as well.  I was able to review her medications and past medical history within epic as well.  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  There is also swelling of that knee with a normal-appearing right knee.  Her right knee exam is entirely normal.  Her left knee does again have valgus malalignment with lateral joint line tenderness and patellofemoral crepitation as well as global pain.  The knee feels ligamentously stable.  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  I did try to aspirate fluid from her knee but was unsuccessful.  I did place a steroid injection in her knee.  She is likely a good candidate for hyaluronic acid but at some point may end up needing a knee replacement.  I described all of this to her and showed her x-rays and a knee model.  I would like to see her back in 4 weeks to see how she is done from a standpoint of the steroid injection.  I did give her handout to consider hyaluronic acid in the future as well depending on her response to the steroid injection.  All question concerns were answered addressed.    Procedure Note  Patient: Michele Boone             Date of Birth: 14-Jan-1961           MRN: [id number]             Visit Date:  12/09/2022  Procedures: Visit Diagnoses:  1. Chronic pain of left knee     Large Joint Inj: L knee on 12/09/2022 5:06 PM Indications: diagnostic evaluation and pain Details: 22 G 1.5 in needle, superolateral approach  Arthrogram: No  Medications: 3 mL lidocaine 1 %; 40 mg methylPREDNISolone acetate 40 MG/ML Outcome: tolerated well, no immediate complications Procedure, treatment alternatives, risks and benefits explained, specific risks discussed. Consent was given by the patient. Immediately prior to procedure a time out was called to verify the correct patient, procedure, equipment, support staff and site/side marked as required. Patient was prepped and draped in the usual sterile fashion.

## 2022-12-15 ENCOUNTER — Other Ambulatory Visit: Payer: Self-pay | Admitting: Orthopaedic Surgery

## 2022-12-15 ENCOUNTER — Telehealth: Payer: Self-pay | Admitting: Orthopaedic Surgery

## 2022-12-15 MED ORDER — MELOXICAM 15 MG PO TABS: 15.0000 mg | ORAL_TABLET | Freq: Every day | ORAL | 3 refills | Status: AC | PRN

## 2022-12-15 NOTE — Telephone Encounter (Signed)
Pt called about script Dr Magnus Ivan was to send in. Meloxicam. Please send to [facility] on [address] Please call pt when sent in at [phone]

## 2022-12-18 DIAGNOSIS — H2513 Age-related nuclear cataract, bilateral: Secondary | ICD-10-CM | POA: Diagnosis not present

## 2022-12-18 DIAGNOSIS — H5213 Myopia, bilateral: Secondary | ICD-10-CM | POA: Diagnosis not present

## 2022-12-18 DIAGNOSIS — H353222 Exudative age-related macular degeneration, left eye, with inactive choroidal neovascularization: Secondary | ICD-10-CM | POA: Diagnosis not present

## 2022-12-18 DIAGNOSIS — H25041 Posterior subcapsular polar age-related cataract, right eye: Secondary | ICD-10-CM | POA: Diagnosis not present

## 2023-01-06 ENCOUNTER — Ambulatory Visit: Payer: BLUE CROSS/BLUE SHIELD | Admitting: Orthopaedic Surgery

## 2023-01-27 ENCOUNTER — Ambulatory Visit: Payer: BC Managed Care – PPO | Admitting: Orthopaedic Surgery

## 2023-01-27 ENCOUNTER — Encounter: Payer: Self-pay | Admitting: Orthopaedic Surgery

## 2023-01-27 DIAGNOSIS — M25562 Pain in left knee: Secondary | ICD-10-CM | POA: Diagnosis not present

## 2023-01-27 DIAGNOSIS — G8929 Other chronic pain: Secondary | ICD-10-CM | POA: Diagnosis not present

## 2023-01-27 NOTE — Progress Notes (Signed)
The patient is here in follow-up for her left knee.  We have seen her for this knee before and she continues to have pain and locking catching in that knee for many months now.  She has tried conservative treatment in terms of activity modification, quad strengthening exercises, anti-inflammatories, offloading her left knee and even steroid injection.  The steroid injection did temporize some of her pain but she is still having locking and catching that has become symptomatic.  Examination of her left knee does show valgus malalignment.  There is some mild effusion of her left knee and her right knee is much more normal alignment and no effusion.  She does have a positive McMurray's exam to the lateral compartment of that left knee and most of her pain is lateral.  Previous x-rays of the left knee still showed the joint space is adequate but there is some slight lateral narrowing and patellofemoral narrowing.  At this point a MRI of her left knee is warranted to assess the cartilage and to rule out a lateral meniscal tear given her continued symptoms combined with the failure of conservative treatment.  We will see her back once we have that MRI.

## 2023-01-27 NOTE — Addendum Note (Signed)
Addended by: Mardene Celeste B on: 01/27/2023 04:24 PM   Modules accepted: Orders

## 2023-02-12 DIAGNOSIS — H43811 Vitreous degeneration, right eye: Secondary | ICD-10-CM | POA: Diagnosis not present

## 2023-02-12 DIAGNOSIS — H43822 Vitreomacular adhesion, left eye: Secondary | ICD-10-CM | POA: Diagnosis not present

## 2023-02-12 DIAGNOSIS — H35371 Puckering of macula, right eye: Secondary | ICD-10-CM | POA: Diagnosis not present

## 2023-02-12 DIAGNOSIS — H353112 Nonexudative age-related macular degeneration, right eye, intermediate dry stage: Secondary | ICD-10-CM | POA: Diagnosis not present

## 2023-02-12 DIAGNOSIS — H353221 Exudative age-related macular degeneration, left eye, with active choroidal neovascularization: Secondary | ICD-10-CM | POA: Diagnosis not present

## 2023-06-11 DIAGNOSIS — H353221 Exudative age-related macular degeneration, left eye, with active choroidal neovascularization: Secondary | ICD-10-CM | POA: Diagnosis not present

## 2023-06-22 DIAGNOSIS — E785 Hyperlipidemia, unspecified: Secondary | ICD-10-CM | POA: Diagnosis not present

## 2023-06-22 DIAGNOSIS — H353220 Exudative age-related macular degeneration, left eye, stage unspecified: Secondary | ICD-10-CM | POA: Diagnosis not present

## 2023-06-22 DIAGNOSIS — Z130 Encounter for screening for diseases of the blood and blood-forming organs and certain disorders involving the immune mechanism: Secondary | ICD-10-CM | POA: Diagnosis not present

## 2023-06-22 DIAGNOSIS — E559 Vitamin D deficiency, unspecified: Secondary | ICD-10-CM | POA: Diagnosis not present

## 2023-06-22 DIAGNOSIS — Z131 Encounter for screening for diabetes mellitus: Secondary | ICD-10-CM | POA: Diagnosis not present

## 2023-06-22 DIAGNOSIS — G5761 Lesion of plantar nerve, right lower limb: Secondary | ICD-10-CM | POA: Diagnosis not present

## 2023-10-05 DIAGNOSIS — H353221 Exudative age-related macular degeneration, left eye, with active choroidal neovascularization: Secondary | ICD-10-CM | POA: Diagnosis not present

## 2023-10-05 DIAGNOSIS — H43811 Vitreous degeneration, right eye: Secondary | ICD-10-CM | POA: Diagnosis not present

## 2023-10-05 DIAGNOSIS — H353112 Nonexudative age-related macular degeneration, right eye, intermediate dry stage: Secondary | ICD-10-CM | POA: Diagnosis not present

## 2023-10-05 DIAGNOSIS — H43822 Vitreomacular adhesion, left eye: Secondary | ICD-10-CM | POA: Diagnosis not present

## 2023-10-05 DIAGNOSIS — H35371 Puckering of macula, right eye: Secondary | ICD-10-CM | POA: Diagnosis not present

## 2023-10-07 DIAGNOSIS — Z0000 Encounter for general adult medical examination without abnormal findings: Secondary | ICD-10-CM | POA: Diagnosis not present

## 2023-10-07 DIAGNOSIS — Z23 Encounter for immunization: Secondary | ICD-10-CM | POA: Diagnosis not present

## 2023-10-07 DIAGNOSIS — F411 Generalized anxiety disorder: Secondary | ICD-10-CM | POA: Diagnosis not present

## 2023-10-07 DIAGNOSIS — E559 Vitamin D deficiency, unspecified: Secondary | ICD-10-CM | POA: Diagnosis not present

## 2023-10-11 DIAGNOSIS — L720 Epidermal cyst: Secondary | ICD-10-CM | POA: Diagnosis not present

## 2023-10-11 DIAGNOSIS — D2262 Melanocytic nevi of left upper limb, including shoulder: Secondary | ICD-10-CM | POA: Diagnosis not present

## 2023-12-20 DIAGNOSIS — H25043 Posterior subcapsular polar age-related cataract, bilateral: Secondary | ICD-10-CM | POA: Diagnosis not present

## 2023-12-20 DIAGNOSIS — H2513 Age-related nuclear cataract, bilateral: Secondary | ICD-10-CM | POA: Diagnosis not present

## 2023-12-20 DIAGNOSIS — H353112 Nonexudative age-related macular degeneration, right eye, intermediate dry stage: Secondary | ICD-10-CM | POA: Diagnosis not present

## 2023-12-20 DIAGNOSIS — H5213 Myopia, bilateral: Secondary | ICD-10-CM | POA: Diagnosis not present

## 2024-01-10 ENCOUNTER — Encounter: Payer: Self-pay | Admitting: Radiology

## 2024-01-25 DIAGNOSIS — H353221 Exudative age-related macular degeneration, left eye, with active choroidal neovascularization: Secondary | ICD-10-CM | POA: Diagnosis not present
